# Patient Record
Sex: Female | Born: 1985 | Race: Black or African American | Hispanic: No | Marital: Single | Smoking: Never smoker
Health system: Southern US, Community
[De-identification: ages and names within clinical notes are randomized; demographics above are authoritative.]

---

## 2021-03-08 ENCOUNTER — Emergency Department (HOSPITAL_COMMUNITY)
Admission: EM | Admit: 2021-03-08 | Discharge: 2021-03-08 | Disposition: A | Discharge status: Home / Self Care | Payer: Self-pay | Attending: Emergency Medicine | Admitting: Emergency Medicine

## 2021-03-08 ENCOUNTER — Other Ambulatory Visit: Payer: Self-pay

## 2021-03-08 ENCOUNTER — Emergency Department (HOSPITAL_COMMUNITY): Payer: Self-pay

## 2021-03-08 ENCOUNTER — Encounter (HOSPITAL_COMMUNITY): Payer: Self-pay | Admitting: Emergency Medicine

## 2021-03-08 DIAGNOSIS — X58XXXA Exposure to other specified factors, initial encounter: Secondary | ICD-10-CM | POA: Insufficient documentation

## 2021-03-08 DIAGNOSIS — M25561 Pain in right knee: Secondary | ICD-10-CM | POA: Insufficient documentation

## 2021-03-08 DIAGNOSIS — Y9361 Activity, american tackle football: Secondary | ICD-10-CM | POA: Insufficient documentation

## 2021-03-08 MED ORDER — KETOROLAC TROMETHAMINE 60 MG/2ML IM SOLN
30.0000 mg | Freq: Once | INTRAMUSCULAR | Status: AC
  Administered 2021-03-08: 30 mg via INTRAMUSCULAR
  Filled 2021-03-08: qty 2

## 2021-03-08 NOTE — ED Triage Notes (Addendum)
Pt states chronic pain to right new d/t service in [employer]. States she was play football yesterday afternoon and sustained new injury to right knee.Pt rates pain 0/10 at rest and 10/10 with ambulation. Right dorsalis pedis pulse present, and sensation intact.

## 2021-03-08 NOTE — Progress Notes (Signed)
Orthopedic Tech Progress Note Patient Details:  Wyvonne Carda 01/14/1986 [id number]  Ortho Devices Type of Ortho Device: Knee Immobilizer, Crutches Ortho Device/Splint Location: Rle Ortho Device/Splint Interventions: Ordered, Application, Adjustment   Post Interventions Patient Tolerated: Well Instructions Provided: Adjustment of device, Care of device  Kyliah Deanda A Arbor Cohen 03/08/2021, 8:32 AM

## 2021-03-08 NOTE — ED Notes (Signed)
Called ortho tech to come place knee immobilizer.

## 2021-03-08 NOTE — ED Provider Notes (Signed)
[facility] EMERGENCY DEPARTMENT Provider Note   CSN: [id number] Arrival date & time: 03/08/21  0540     History Chief Complaint  Patient presents with   Knee Injury    Right    Brenda Rasmussen is a 35 y.o. female.  Right knee injury after hyperextension injury playing football. Also had some type of varus stress during the landing as well and thinks she heard/felt a pop around the same time. Difficulty with extending right lower leg 2/2 pain. No h/o same.        History reviewed. No pertinent past medical history.  There are no problems to display for this patient.   History reviewed. No pertinent surgical history.   OB History   No obstetric history on file.     History reviewed. No pertinent family history.  Social History   Tobacco Use   Smoking status: Never   Smokeless tobacco: Never  Substance Use Topics   Alcohol use: Not Currently   Drug use: Not Currently    Home Medications Prior to Admission medications   Not on File    Allergies    Patient has no allergy information on record.  Review of Systems   Review of Systems  All other systems reviewed and are negative.  Physical Exam Updated Vital Signs BP 127/63 (BP Location: Right Arm)   Pulse 77   Temp 98.2 F (36.8 C) (Oral)   Resp 20   Ht 5\' 2"  (1.575 m)   Wt 65.8 kg   LMP 03/01/2021 Comment: pt shielded  SpO2 98%   BMI 26.52 kg/m   Physical Exam Vitals and nursing note reviewed.  Constitutional:      Appearance: She is well-developed.  HENT:     Head: Normocephalic and atraumatic.     Mouth/Throat:     Mouth: Mucous membranes are moist.     Pharynx: Oropharynx is clear.  Eyes:     Pupils: Pupils are equal, round, and reactive to light.  Cardiovascular:     Rate and Rhythm: Normal rate and regular rhythm.     Comments: Pulses intact Pulmonary:     Effort: No respiratory distress.     Breath sounds: No stridor.  Abdominal:     General: Abdomen is flat.  There is no distension.  Musculoskeletal:        General: Tenderness (on medial knee, pain with passive ROM of knee and active. did not tolerate full knee test for drawer/valgus/varus stress) present. No swelling.     Cervical back: Normal range of motion.  Skin:    General: Skin is warm and dry.  Neurological:     General: No focal deficit present.     Mental Status: She is alert.    ED Results / Procedures / Treatments   Labs (all labs ordered are listed, but only abnormal results are displayed) Labs Reviewed - No data to display  EKG None  Radiology DG Knee Complete 4 Views Right  Result Date: 03/08/2021 CLINICAL DATA:  35 year old female with history of injury to the right knee complaining of right knee pain. EXAM: RIGHT KNEE - COMPLETE 4+ VIEW COMPARISON:  No priors. FINDINGS: No evidence of fracture, dislocation, or joint effusion. No evidence of arthropathy or other focal bone abnormality. Soft tissues are unremarkable. IMPRESSION: Negative. Electronically Signed   By: 36 M.D.   On: 03/08/2021 06:16    Procedures Procedures   Medications Ordered in ED Medications  ketorolac (TORADOL) injection 30  mg (has no administration in time range)    ED Course  I have reviewed the triage vital signs and the nursing notes.  Pertinent labs & imaging results that were available during my care of the patient were reviewed by me and considered in my medical decision making (see chart for details).    MDM Rules/Calculators/A&P                          Suspect some type of ligamentous injury to knee. Xr ok. No e/o infection or severe instability. Knee immobilizer placed. Crutches provided. Plan for ortho/sports medicine follow up if not improving in next 3-5 days with RICE type therapy.   Final Clinical Impression(s) / ED Diagnoses Final diagnoses:  Acute pain of right knee    Rx / DC Orders ED Discharge Orders     None        Averiana Clouatre, Barbara Cower, MD 03/08/21  [phone]

## 2022-07-07 IMAGING — DX DG KNEE COMPLETE 4+V*R*
4 series · 4 of 4 positions shown · non-contrast
Comparison: No priors.

CLINICAL DATA: 34-year-old female with history of injury to the
right knee complaining of right knee pain.

EXAM:
RIGHT KNEE - COMPLETE 4+ VIEW

[knee ap]
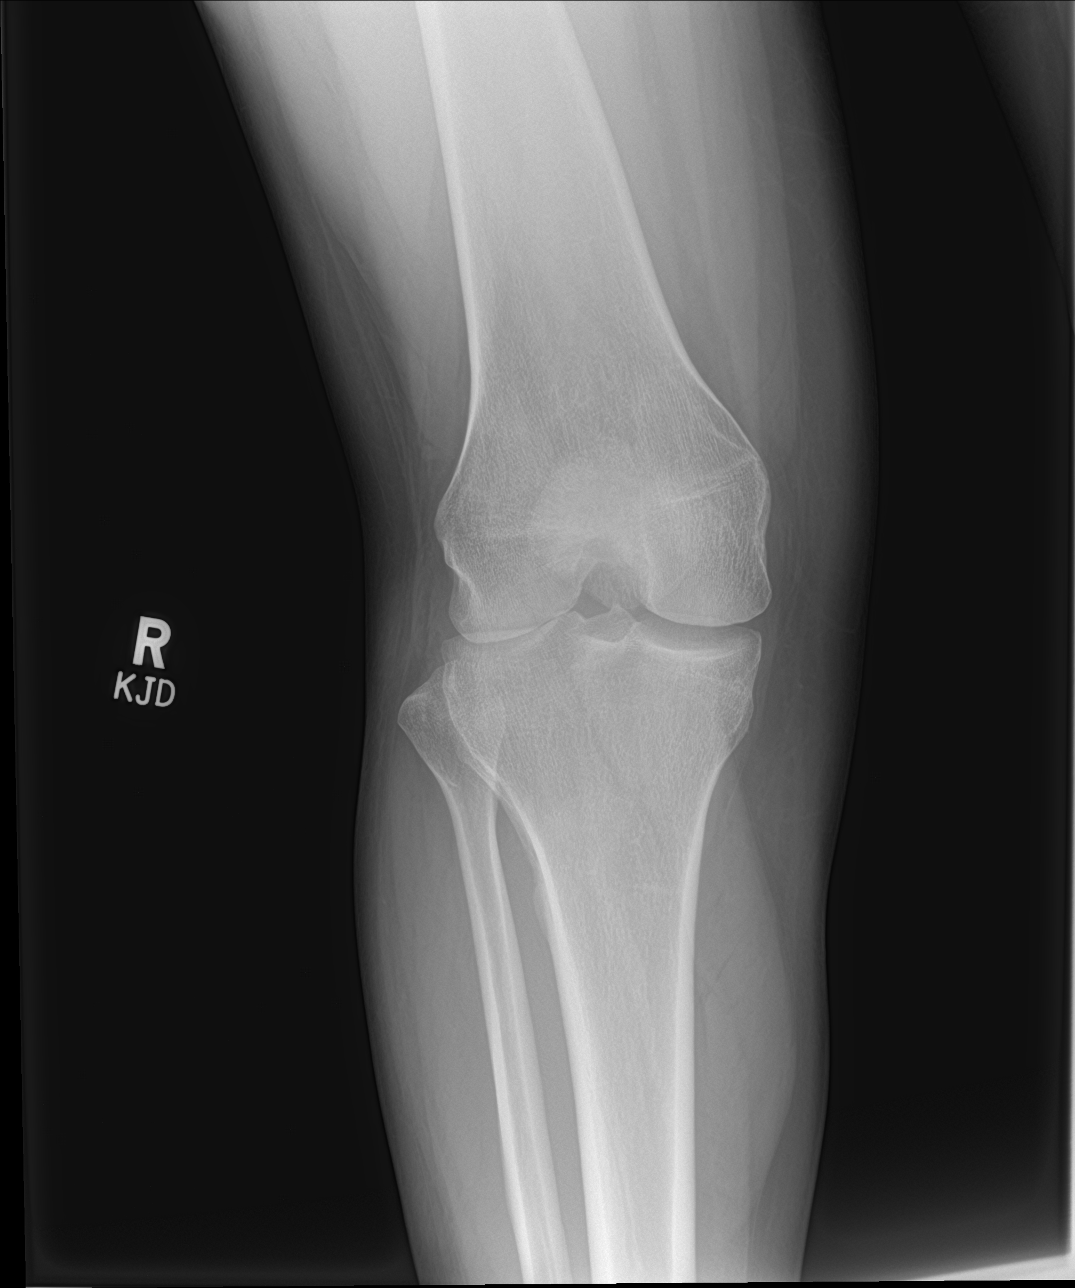

[knee lat]
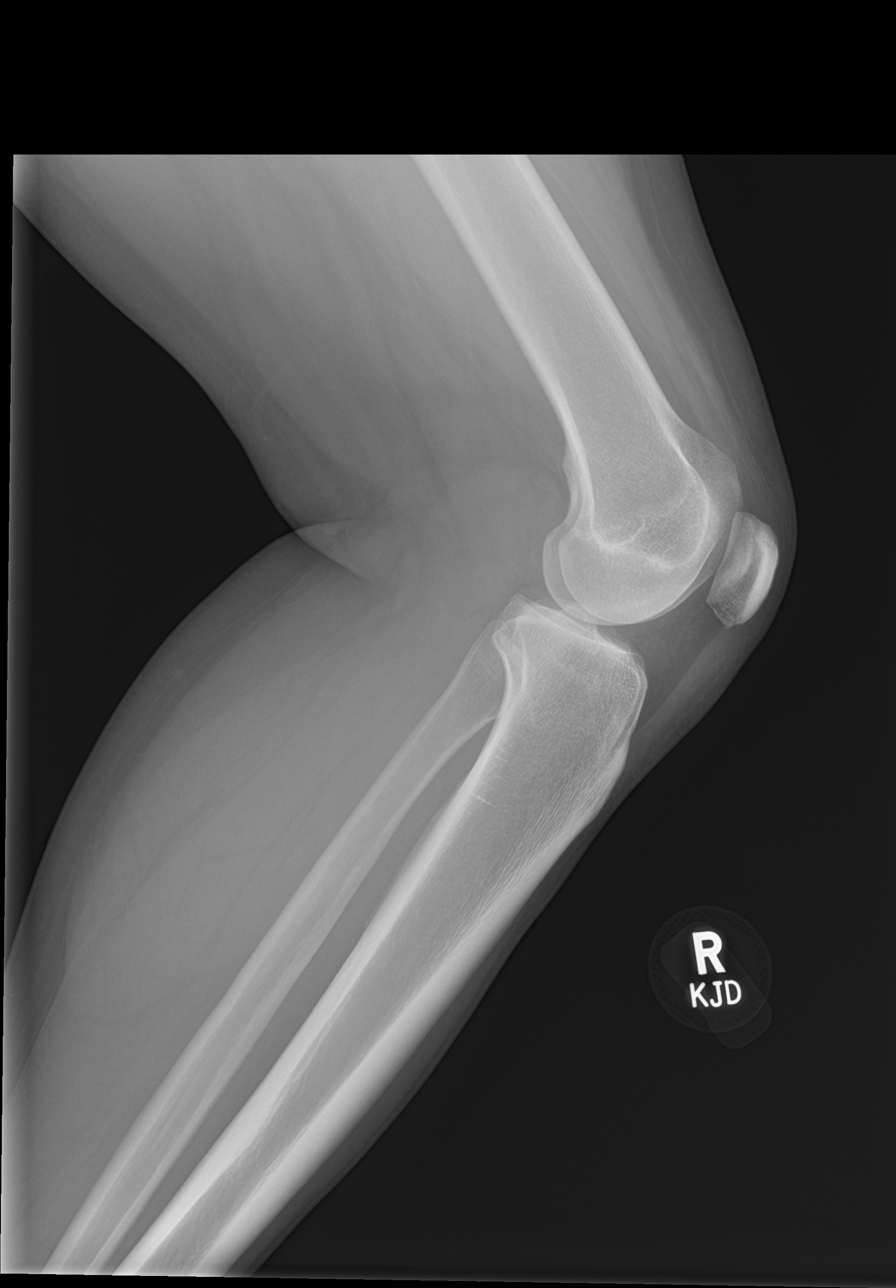

[knee obl (1 of 2)]
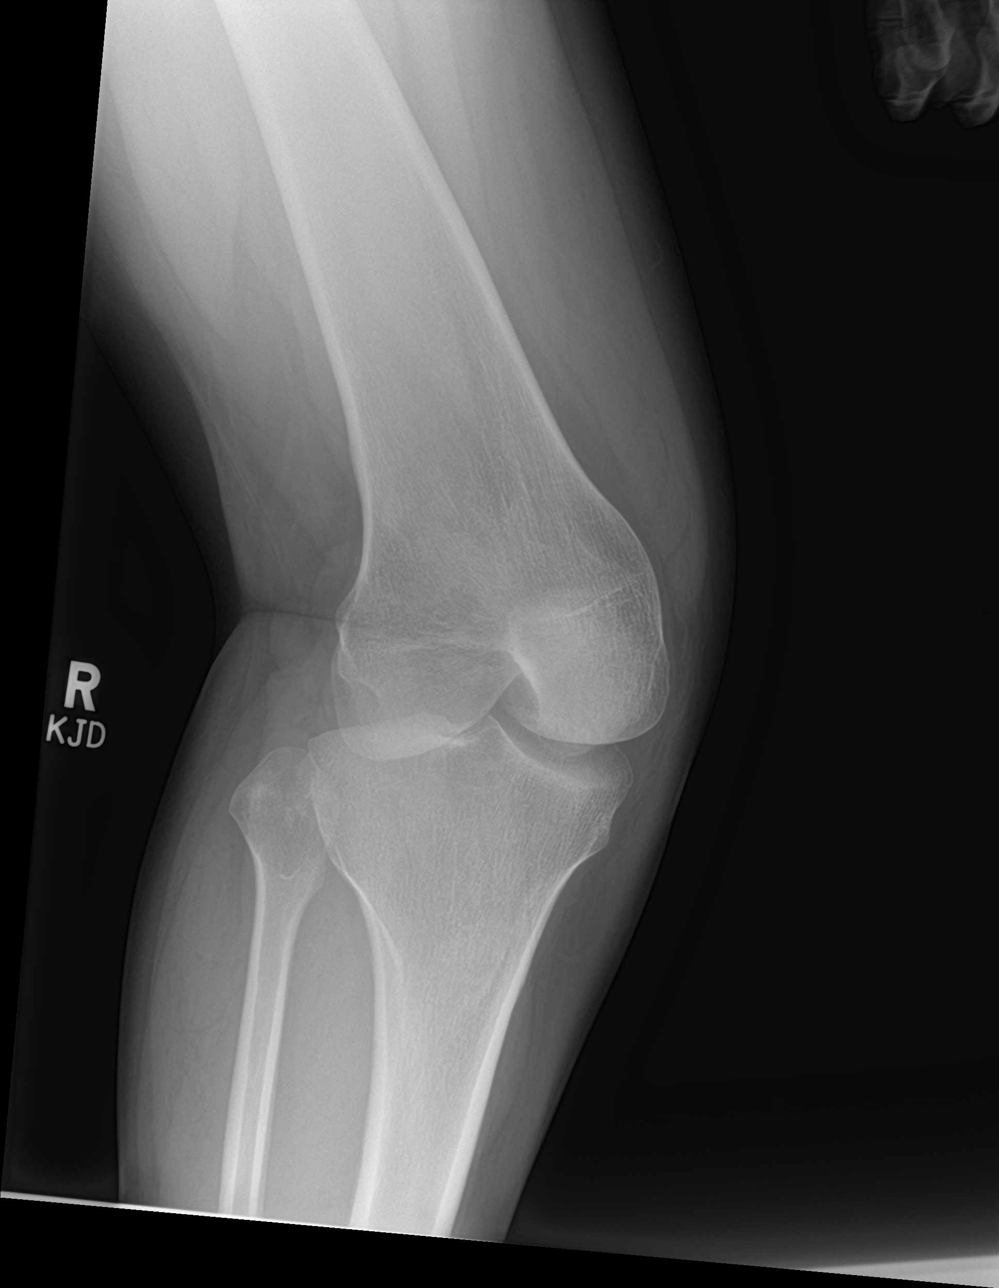

[knee obl (2 of 2)]
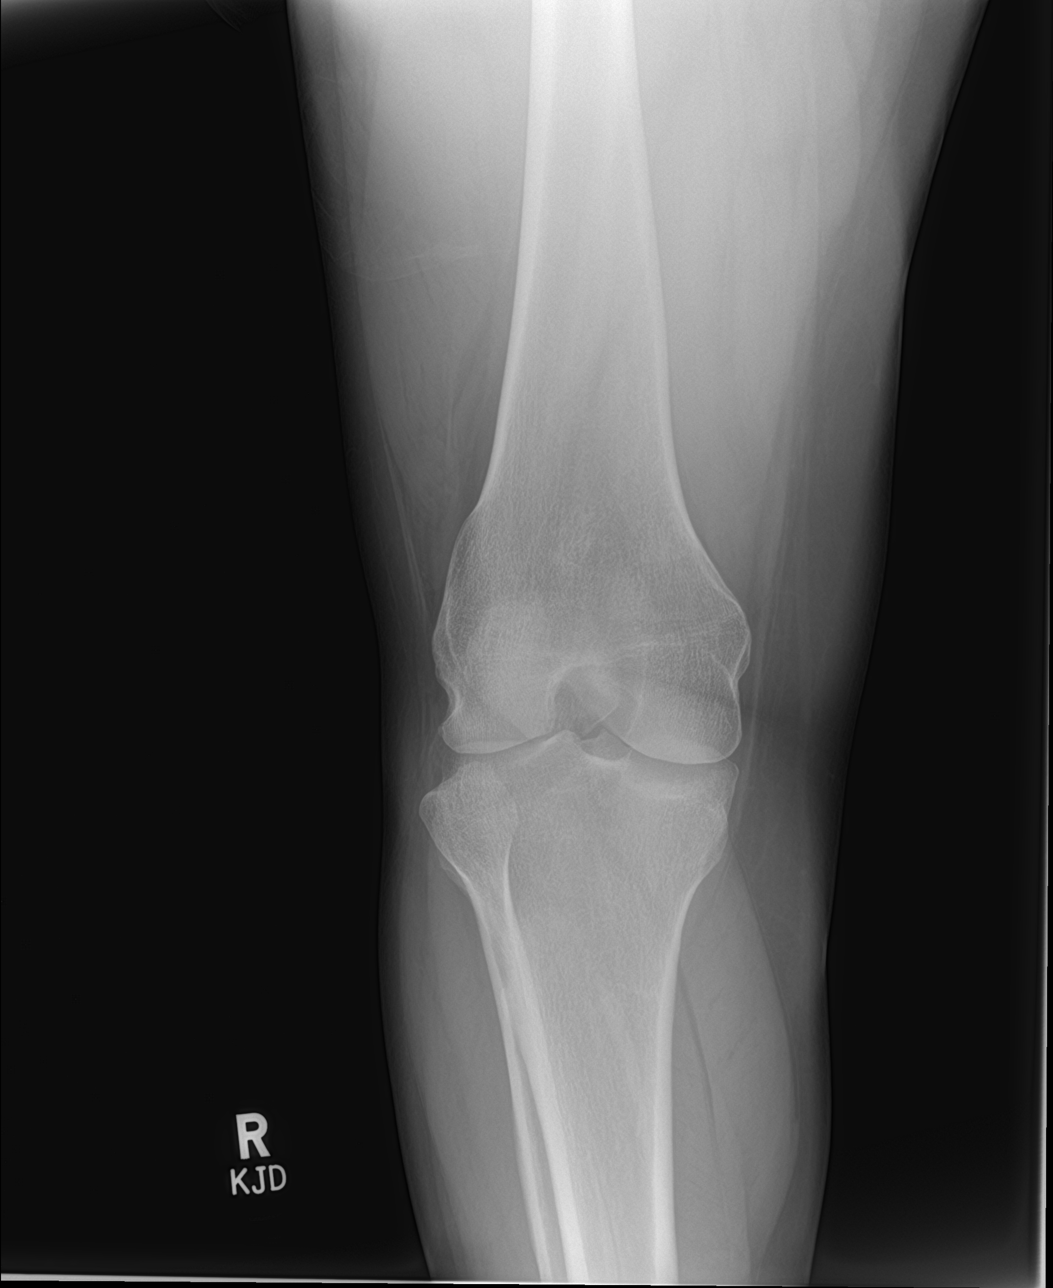

[4 of 4 positions shown; findings below may reference images not displayed]

FINDINGS: No evidence of fracture, dislocation, or joint effusion. No evidence
of arthropathy or other focal bone abnormality. Soft tissues are
unremarkable.
IMPRESSION: Negative.
